# Patient Record
Sex: Male | Born: 1955 | Race: Black or African American | Hispanic: No | Smoking: Former smoker
Health system: Southern US, Community
[De-identification: ages and names within clinical notes are randomized; demographics above are authoritative.]

## PROBLEM LIST (undated history)

## (undated) DIAGNOSIS — Z87891 Personal history of nicotine dependence: Secondary | ICD-10-CM

## (undated) HISTORY — PX: KNEE SURGERY: SHX244

## (undated) HISTORY — PX: ANKLE SURGERY: SHX546

---

## 2013-03-16 ENCOUNTER — Encounter (HOSPITAL_COMMUNITY): Payer: Self-pay | Admitting: Emergency Medicine

## 2013-03-16 ENCOUNTER — Emergency Department (HOSPITAL_COMMUNITY): Payer: Self-pay

## 2013-03-16 ENCOUNTER — Inpatient Hospital Stay (HOSPITAL_COMMUNITY)
Admission: EM | Admit: 2013-03-16 | Discharge: 2013-03-21 | DRG: 348 | Disposition: A | Discharge status: Home / Self Care | Payer: MEDICAID | Attending: General Surgery | Admitting: General Surgery

## 2013-03-16 DIAGNOSIS — K612 Anorectal abscess: Principal | ICD-10-CM | POA: Diagnosis present

## 2013-03-16 DIAGNOSIS — K921 Melena: Secondary | ICD-10-CM | POA: Diagnosis present

## 2013-03-16 DIAGNOSIS — Z87891 Personal history of nicotine dependence: Secondary | ICD-10-CM

## 2013-03-16 DIAGNOSIS — R0602 Shortness of breath: Secondary | ICD-10-CM | POA: Diagnosis not present

## 2013-03-16 DIAGNOSIS — K611 Rectal abscess: Secondary | ICD-10-CM | POA: Diagnosis present

## 2013-03-16 DIAGNOSIS — K648 Other hemorrhoids: Secondary | ICD-10-CM | POA: Diagnosis present

## 2013-03-16 DIAGNOSIS — K644 Residual hemorrhoidal skin tags: Secondary | ICD-10-CM | POA: Diagnosis present

## 2013-03-16 DIAGNOSIS — D72829 Elevated white blood cell count, unspecified: Secondary | ICD-10-CM | POA: Diagnosis present

## 2013-03-16 HISTORY — DX: Personal history of nicotine dependence: Z87.891

## 2013-03-16 LAB — CBC WITH DIFFERENTIAL/PLATELET
Basophils Absolute: 0 10*3/uL (ref 0.0–0.1)
Basophils Relative: 0 % (ref 0–1)
Eosinophils Relative: 0 % (ref 0–5)
HCT: 40.8 % (ref 39.0–52.0)
Hemoglobin: 13.9 g/dL (ref 13.0–17.0)
Lymphocytes Relative: 12 % (ref 12–46)
Monocytes Relative: 8 % (ref 3–12)
Neutro Abs: 17.4 10*3/uL — ABNORMAL HIGH (ref 1.7–7.7)
RBC: 4.49 MIL/uL (ref 4.22–5.81)
RDW: 12.2 % (ref 11.5–15.5)
WBC: 21.7 10*3/uL — ABNORMAL HIGH (ref 4.0–10.5)

## 2013-03-16 LAB — BASIC METABOLIC PANEL
BUN: 12 mg/dL (ref 6–23)
CO2: 28 mEq/L (ref 19–32)
Chloride: 96 mEq/L (ref 96–112)
GFR calc Af Amer: >90 mL/min (ref >90)
Potassium: 3.7 mEq/L (ref 3.5–5.1)

## 2013-03-16 MED ORDER — ONDANSETRON HCL 4 MG/2ML IJ SOLN
4.0000 mg | Freq: Once | INTRAMUSCULAR | Status: AC
  Administered 2013-03-16: 4 mg via INTRAVENOUS
  Filled 2013-03-16: qty 2

## 2013-03-16 MED ORDER — IOHEXOL 300 MG/ML  SOLN
50.0000 mL | Freq: Once | INTRAMUSCULAR | Status: AC | PRN
  Administered 2013-03-16: 50 mL via ORAL

## 2013-03-16 MED ORDER — MORPHINE SULFATE 4 MG/ML IJ SOLN
4.0000 mg | Freq: Once | INTRAMUSCULAR | Status: AC
  Administered 2013-03-16: 4 mg via INTRAVENOUS
  Filled 2013-03-16: qty 1

## 2013-03-16 MED ORDER — IOHEXOL 300 MG/ML  SOLN
100.0000 mL | Freq: Once | INTRAMUSCULAR | Status: AC | PRN
  Administered 2013-03-16: 100 mL via INTRAVENOUS

## 2013-03-16 MED ORDER — SODIUM CHLORIDE 0.9 % IV BOLUS (SEPSIS)
1000.0000 mL | Freq: Once | INTRAVENOUS | Status: AC
  Administered 2013-03-16: 1000 mL via INTRAVENOUS

## 2013-03-16 NOTE — ED Notes (Signed)
Pt presents today with c/o of hemorrhoids and bloody stool. Pt reports being constipated for three weeks, however had a bowel movement last night.  Pt reports having a fever last night of 102.6. Triage temp was 99.4.

## 2013-03-16 NOTE — ED Provider Notes (Signed)
CSN: [id number]     Arrival date & time 03/16/13  1645 History   First MD Initiated Contact with Patient 03/16/13 1703     Chief Complaint  Patient presents with  . Hemorrhoids   (Consider location/radiation/quality/duration/timing/severity/associated sxs/prior Treatment) HPI Comments: Patient presents to ER for evaluation of hemorrhoids. Patient reports that he has been having hemorrhoid pain for approximately 3 weeks. Symptoms have progressively worsened. He has a history of recurrent hemorrhoids, but has never had a significant problem. Patient reports that he has been using preparation H., ice baths, no improvement. This is unusual for him. Last night he ran a fever to 102.6. He has not had any sinus congestion, sore throat, cough, nausea, vomiting to explain symptoms. He did have diarrhea earlier in the week, this has resolved. Patient denies abdominal pain.   History reviewed. No pertinent past medical history. Past Surgical History  Procedure Laterality Date  . Ankle surgery     No family history on file. History  Substance Use Topics  . Smoking status: Former Smoker -- 0.25 packs/day    Types: Cigarettes    Quit date: 02/23/2013  . Smokeless tobacco: Never Used  . Alcohol Use: No    Review of Systems  Constitutional: Positive for fever.  Respiratory: Negative for shortness of breath.   Cardiovascular: Negative for chest pain.  Gastrointestinal: Positive for diarrhea, blood in stool and rectal pain. Negative for abdominal pain.  All other systems reviewed and are negative.    Allergies  Review of patient's allergies indicates not on file.  Home Medications  No current outpatient prescriptions on file. BP 141/88  Pulse 147  Temp(Src) 99.4 F (37.4 C) (Oral)  Resp 20  SpO2 98% Physical Exam  Constitutional: He is oriented to person, place, and time. He appears well-developed and well-nourished. No distress.  HENT:  Head: Normocephalic and atraumatic.  Right Ear:  Hearing normal.  Left Ear: Hearing normal.  Nose: Nose normal.  Mouth/Throat: Oropharynx is clear and moist and mucous membranes are normal.  Eyes: Conjunctivae and EOM are normal. Pupils are equal, round, and reactive to light.  Neck: Normal range of motion. Neck supple.  Cardiovascular: Regular rhythm, S1 normal and S2 normal.  Exam reveals no gallop and no friction rub.   No murmur heard. Pulmonary/Chest: Effort normal and breath sounds normal. No respiratory distress. He exhibits no tenderness.  Abdominal: Soft. Normal appearance and bowel sounds are normal. There is no hepatosplenomegaly. There is no tenderness. There is no rebound, no guarding, no tenderness at McBurney's point and negative Murphy's sign. No hernia.  Genitourinary: Rectal exam shows external hemorrhoid.  Musculoskeletal: Normal range of motion.  Neurological: He is alert and oriented to person, place, and time. He has normal strength. No cranial nerve deficit or sensory deficit. Coordination normal. GCS eye subscore is 4. GCS verbal subscore is 5. GCS motor subscore is 6.  Skin: Skin is warm, dry and intact. No rash noted. No cyanosis.  Psychiatric: He has a normal mood and affect. His speech is normal and behavior is normal. Thought content normal.    ED Course  Procedures (including critical care time) Labs Review Labs Reviewed  CBC WITH DIFFERENTIAL - Abnormal; Notable for the following:    WBC 21.7 (*)    Neutrophils Relative % 80 (*)    Neutro Abs 17.4 (*)    Monocytes Absolute 1.7 (*)    All other components within normal limits  BASIC METABOLIC PANEL - Abnormal; Notable for the following:  Sodium 133 (*)    Glucose, Bld 107 (*)    GFR calc non Af Amer 78 (*)    All other components within normal limits  CULTURE, BLOOD (ROUTINE X 2)  CULTURE, BLOOD (ROUTINE X 2)   Imaging Review Ct Abdomen Pelvis W Contrast  03/16/2013   *RADIOLOGY REPORT*  Clinical Data: Rectal pain.  Fever.  Leukocytosis.  CT  ABDOMEN AND PELVIS WITH CONTRAST  Technique:  Multidetector CT imaging of the abdomen and pelvis was performed following the standard protocol during bolus administration of intravenous contrast.  Contrast: OMNIPAQUE IOHEXOL 300 MG/ML  SOLN  Comparison: None.  Findings: Lung Bases: Dependent atelectasis at the lung bases.  Liver:  Tiny low density lesion in the left hepatic lobe likely represents a cyst.  Spleen:  Normal.  Gallbladder:  Normal.  Common bile duct:  Normal.  Pancreas:  Normal.  Adrenal glands:  Mild thickening on the left, likely hyperplasia. No discrete lesion.  Kidneys:  Simple left upper pole renal cyst.  Normal renal enhancement.  A 3 mm right inferior pole renal collecting system calculus.  Ureters appear within normal limits.  Delayed renal excretion of contrast is normal.  Stomach:  Normal.  Small bowel:  Normal.  Colon:   Appendix not identified.  Distal colon decompressed. There is a perirectal fluid collection which is crescent shaped and extends from the dorsal aspect of the rectum into the perineal subcutaneous fat.  The maximal thickness in the AP dimension is 4.3 cm.  Transversely, this measures 8 cm.  Cranial-caudal extent is at least 6 cm. There is peri rectal phlegmon and fluid in the ischiorectal fossa.  Adjacent to the rectum, the fluid collection appears complex.  This is consistent with abscess.  Pelvic Genitourinary:  Distended urinary bladder.  Prostate gland appears within normal limits.  Bones:  No aggressive osseous lesions.  Ankylosis of the SI joints.  Vasculature: Retroaortic left renal vein.  Body Wall: Small fat containing lumbar hernia on the right.  IMPRESSION: 1.  Perirectal fluid collection consistent with perirectal abscess. This lies dorsal to the anorectal junction measuring 8 cm long axis. 2.  Left renal cyst. 3.  Nonobstructing right inferior pole 3 mm renal collecting system calculus.   Original Report Authenticated By: Andreas Newport, M.D.    MDM   Diagnosis: Perirectal abscess   Patient presents to ER stating that he has been experiencing 3 weeks of progressively worsening hemorrhoid pain. Patient reports that he has had problems with hemorrhoids in the past, but never this bad. Initial examination revealed no abdominal tenderness and he has not had any abdominal pain. External examination reveals a moderately sized external hemorrhoid that is not thrombosed. The rectal examination was performed because of the presence of a hemorrhoids and the amount of discomfort the patient was experiencing. Patient tells me that he had a temperature of 102.6 last night which is not explained by the hemorrhoid. I opted to perform blood work to further evaluate and he does have a significant leukocytosis. CAT scan was therefore performed to rule out pelvic abscess and there is a perirectal fluid collection consistent with a perirectal abscess seen. General Surgery Consulted, Dr. Biagio Quint - is in OR currently, will see patient after he is out of surgery.    Gilda Crease, MD 03/16/13 2159

## 2013-03-16 NOTE — ED Notes (Signed)
MD at bedside. 

## 2013-03-16 NOTE — ED Notes (Signed)
Patient transported to CT 

## 2013-03-16 NOTE — ED Notes (Signed)
Pt refusing IV unless he needs antibiotics

## 2013-03-16 NOTE — H&P (Signed)
Reason for Consult:perirectal abscess Referring Physician: Sharbel Sahagun is an 57 y.o. male.  HPI: 3 week history of hemorrhoids which he has been treating with preparation H.  This had been helping his symptoms until earlier this week when he began having pain in the rectal area and he had associated fever of 102 last night.  He also has had some clotted blood in the stool yesterday but says that his bowels have been normal otherwise.  He came in today because of the pain and CT scan was ordered which demonstrated perirectal abscess  History reviewed. No pertinent past medical history.  Past Surgical History  Procedure Laterality Date  . Ankle surgery      No family history on file.  Social History:  reports that he quit smoking about 3 weeks ago. His smoking use included Cigarettes. He smoked 0.25 packs per day. He has never used smokeless tobacco. He reports that he does not drink alcohol or use illicit drugs.  Allergies:  Allergies  Allergen Reactions  . Penicillins     Medications: I have reviewed the patient's current medications.  Results for orders placed during the hospital encounter of 03/16/13 (from the past 48 hour(s))  CBC WITH DIFFERENTIAL     Status: Abnormal   Collection Time    03/16/13  5:49 PM      Result Value Range   WBC 21.7 (*) 4.0 - 10.5 K/uL   RBC 4.49  4.22 - 5.81 MIL/uL   Hemoglobin 13.9  13.0 - 17.0 g/dL   HCT 40.9  81.1 - 91.4 %   MCV 90.9  78.0 - 100.0 fL   MCH 31.0  26.0 - 34.0 pg   MCHC 34.1  30.0 - 36.0 g/dL   RDW 78.2  95.6 - 21.3 %   Platelets 366  150 - 400 K/uL   Neutrophils Relative % 80 (*) 43 - 77 %   Lymphocytes Relative 12  12 - 46 %   Monocytes Relative 8  3 - 12 %   Eosinophils Relative 0  0 - 5 %   Basophils Relative 0  0 - 1 %   Neutro Abs 17.4 (*) 1.7 - 7.7 K/uL   Lymphs Abs 2.6  0.7 - 4.0 K/uL   Monocytes Absolute 1.7 (*) 0.1 - 1.0 K/uL   Eosinophils Absolute 0.0  0.0 - 0.7 K/uL   Basophils Absolute 0.0  0.0  - 0.1 K/uL   Smear Review MORPHOLOGY UNREMARKABLE    BASIC METABOLIC PANEL     Status: Abnormal   Collection Time    03/16/13  5:49 PM      Result Value Range   Sodium 133 (*) 135 - 145 mEq/L   Potassium 3.7  3.5 - 5.1 mEq/L   Chloride 96  96 - 112 mEq/L   CO2 28  19 - 32 mEq/L   Glucose, Bld 107 (*) 70 - 99 mg/dL   BUN 12  6 - 23 mg/dL   Creatinine, Ser 0.86  0.50 - 1.35 mg/dL   Calcium 9.6  8.4 - 57.8 mg/dL   GFR calc non Af Amer 78 (*) >90 mL/min   GFR calc Af Amer >90  >90 mL/min   Comment: (NOTE)     The eGFR has been calculated using the CKD EPI equation.     This calculation has not been validated in all clinical situations.     eGFR's persistently <90 mL/min signify possible Chronic Kidney  Disease.    Ct Abdomen Pelvis W Contrast  03/16/2013   *RADIOLOGY REPORT*  Clinical Data: Rectal pain.  Fever.  Leukocytosis.  CT ABDOMEN AND PELVIS WITH CONTRAST  Technique:  Multidetector CT imaging of the abdomen and pelvis was performed following the standard protocol during bolus administration of intravenous contrast.  Contrast: OMNIPAQUE IOHEXOL 300 MG/ML  SOLN  Comparison: None.  Findings: Lung Bases: Dependent atelectasis at the lung bases.  Liver:  Tiny low density lesion in the left hepatic lobe likely represents a cyst.  Spleen:  Normal.  Gallbladder:  Normal.  Common bile duct:  Normal.  Pancreas:  Normal.  Adrenal glands:  Mild thickening on the left, likely hyperplasia. No discrete lesion.  Kidneys:  Simple left upper pole renal cyst.  Normal renal enhancement.  A 3 mm right inferior pole renal collecting system calculus.  Ureters appear within normal limits.  Delayed renal excretion of contrast is normal.  Stomach:  Normal.  Small bowel:  Normal.  Colon:   Appendix not identified.  Distal colon decompressed. There is a perirectal fluid collection which is crescent shaped and extends from the dorsal aspect of the rectum into the perineal subcutaneous fat.  The maximal  thickness in the AP dimension is 4.3 cm.  Transversely, this measures 8 cm.  Cranial-caudal extent is at least 6 cm. There is peri rectal phlegmon and fluid in the ischiorectal fossa.  Adjacent to the rectum, the fluid collection appears complex.  This is consistent with abscess.  Pelvic Genitourinary:  Distended urinary bladder.  Prostate gland appears within normal limits.  Bones:  No aggressive osseous lesions.  Ankylosis of the SI joints.  Vasculature: Retroaortic left renal vein.  Body Wall: Small fat containing lumbar hernia on the right.  IMPRESSION: 1.  Perirectal fluid collection consistent with perirectal abscess. This lies dorsal to the anorectal junction measuring 8 cm long axis. 2.  Left renal cyst. 3.  Nonobstructing right inferior pole 3 mm renal collecting system calculus.   Original Report Authenticated By: Andreas Newport, M.D.    All other review of systems negative or noncontributory except as stated in the HPI   Blood pressure 127/78, pulse 79, temperature 99.4 F (37.4 C), temperature source Oral, resp. rate 18, SpO2 98.00%. General appearance: alert, cooperative and no distress Resp: clear to auscultation bilaterally Cardio: normal rate, regular GI: soft, non-tender; bowel sounds normal; no masses,  no organomegaly and rectal exam not performed due to pain.  he does have two large hemorrhoids with evidence of thrombosis, these are tender but he is more tender in the posterior midline.  I do not appreciate any fluctuance or any external drainage. Extremities: extremities normal, atraumatic, no cyanosis or edema Neurologic: Grossly normal  Assessment/Plan: Perirectal abscess and hemorrhoids I think that this would be best examined with EUA.  He has a large abscess on CT with elevated wbc and fever yesterday.  He is HD stable and he really does not have any erythema or induration or fluctuance externally.  I have recommended admission with IV abx and rectal EUA for possible  drainage of the abscess.  Lodema Pilot DAVID 03/16/2013, 11:42 PM

## 2013-03-16 NOTE — ED Notes (Signed)
Dr. Darylene Price at beside.

## 2013-03-17 ENCOUNTER — Encounter (HOSPITAL_COMMUNITY): Payer: Self-pay | Admitting: Anesthesiology

## 2013-03-17 ENCOUNTER — Encounter (HOSPITAL_COMMUNITY): Admission: EM | Disposition: A | Discharge status: Home / Self Care | Payer: Self-pay | Source: Home / Self Care

## 2013-03-17 ENCOUNTER — Inpatient Hospital Stay (HOSPITAL_COMMUNITY): Payer: Self-pay | Admitting: Anesthesiology

## 2013-03-17 DIAGNOSIS — K612 Anorectal abscess: Secondary | ICD-10-CM

## 2013-03-17 HISTORY — PX: INCISION AND DRAINAGE PERIRECTAL ABSCESS: SHX1804

## 2013-03-17 LAB — CBC
HCT: 36.8 % — ABNORMAL LOW (ref 39.0–52.0)
MCV: 91.1 fL (ref 78.0–100.0)
RBC: 4.04 MIL/uL — ABNORMAL LOW (ref 4.22–5.81)
WBC: 24.1 10*3/uL — ABNORMAL HIGH (ref 4.0–10.5)

## 2013-03-17 SURGERY — INCISION AND DRAINAGE, ABSCESS, PERIRECTAL
Anesthesia: General | Site: Rectum | Wound class: Dirty or Infected

## 2013-03-17 MED ORDER — ONDANSETRON HCL 4 MG/2ML IJ SOLN: 4.0000 mg | Freq: Four times a day (QID) | INTRAMUSCULAR | Status: DC | PRN

## 2013-03-17 MED ORDER — MIDAZOLAM HCL 5 MG/5ML IJ SOLN
INTRAMUSCULAR | Status: DC | PRN
  Administered 2013-03-17: 2 mg via INTRAVENOUS

## 2013-03-17 MED ORDER — PSYLLIUM 95 % PO PACK
1.0000 | PACK | Freq: Two times a day (BID) | ORAL | Status: DC
  Administered 2013-03-17 – 2013-03-21 (×9): 1 via ORAL
  Filled 2013-03-17 (×11): qty 1

## 2013-03-17 MED ORDER — MORPHINE SULFATE 2 MG/ML IJ SOLN: 1.0000 mg | INTRAMUSCULAR | Status: DC | PRN

## 2013-03-17 MED ORDER — ONDANSETRON HCL 4 MG/2ML IJ SOLN
INTRAMUSCULAR | Status: DC | PRN
  Administered 2013-03-17: 4 mg via INTRAVENOUS

## 2013-03-17 MED ORDER — HEPARIN SODIUM (PORCINE) 5000 UNIT/ML IJ SOLN
5000.0000 [IU] | Freq: Three times a day (TID) | INTRAMUSCULAR | Status: DC
  Administered 2013-03-17 – 2013-03-20 (×9): 5000 [IU] via SUBCUTANEOUS
  Filled 2013-03-17 (×13): qty 1

## 2013-03-17 MED ORDER — BUPIVACAINE LIPOSOME 1.3 % IJ SUSP
20.0000 mL | Freq: Once | INTRAMUSCULAR | Status: DC
  Filled 2013-03-17: qty 20

## 2013-03-17 MED ORDER — HYDROMORPHONE HCL PF 1 MG/ML IJ SOLN
INTRAMUSCULAR | Status: AC
  Filled 2013-03-17: qty 1

## 2013-03-17 MED ORDER — KCL IN DEXTROSE-NACL 20-5-0.45 MEQ/L-%-% IV SOLN
INTRAVENOUS | Status: DC
  Administered 2013-03-17: 01:00:00 via INTRAVENOUS
  Filled 2013-03-17 (×3): qty 1000

## 2013-03-17 MED ORDER — DIAZEPAM 5 MG PO TABS: 5.0000 mg | ORAL_TABLET | Freq: Four times a day (QID) | ORAL | Status: DC | PRN

## 2013-03-17 MED ORDER — PROMETHAZINE HCL 25 MG/ML IJ SOLN: 6.2500 mg | INTRAMUSCULAR | Status: DC | PRN

## 2013-03-17 MED ORDER — SUCCINYLCHOLINE CHLORIDE 20 MG/ML IJ SOLN
INTRAMUSCULAR | Status: DC | PRN
  Administered 2013-03-17: 100 mg via INTRAVENOUS

## 2013-03-17 MED ORDER — CIPROFLOXACIN IN D5W 400 MG/200ML IV SOLN
400.0000 mg | Freq: Two times a day (BID) | INTRAVENOUS | Status: DC
  Administered 2013-03-17 – 2013-03-18 (×3): 400 mg via INTRAVENOUS
  Filled 2013-03-17 (×4): qty 200

## 2013-03-17 MED ORDER — SODIUM CHLORIDE 0.9 % IJ SOLN
INTRAMUSCULAR | Status: DC | PRN
  Administered 2013-03-17: 11:00:00

## 2013-03-17 MED ORDER — LACTATED RINGERS IV SOLN: INTRAVENOUS | Status: DC

## 2013-03-17 MED ORDER — LACTATED RINGERS IV SOLN
INTRAVENOUS | Status: DC | PRN
  Administered 2013-03-17: 10:00:00 via INTRAVENOUS

## 2013-03-17 MED ORDER — KCL IN DEXTROSE-NACL 20-5-0.45 MEQ/L-%-% IV SOLN
INTRAVENOUS | Status: DC
  Administered 2013-03-17: 20 mL/h via INTRAVENOUS
  Filled 2013-03-17 (×3): qty 1000

## 2013-03-17 MED ORDER — HYDROMORPHONE HCL PF 1 MG/ML IJ SOLN
0.2500 mg | INTRAMUSCULAR | Status: DC | PRN
  Administered 2013-03-17 (×2): 0.5 mg via INTRAVENOUS

## 2013-03-17 MED ORDER — PROPOFOL 10 MG/ML IV BOLUS
INTRAVENOUS | Status: DC | PRN
  Administered 2013-03-17: 150 mg via INTRAVENOUS

## 2013-03-17 MED ORDER — METRONIDAZOLE IN NACL 5-0.79 MG/ML-% IV SOLN
500.0000 mg | Freq: Three times a day (TID) | INTRAVENOUS | Status: DC
  Administered 2013-03-17 – 2013-03-18 (×4): 500 mg via INTRAVENOUS
  Filled 2013-03-17 (×5): qty 100

## 2013-03-17 MED ORDER — FENTANYL CITRATE 0.05 MG/ML IJ SOLN
INTRAMUSCULAR | Status: DC | PRN
  Administered 2013-03-17: 100 ug via INTRAVENOUS
  Administered 2013-03-17 (×2): 50 ug via INTRAVENOUS

## 2013-03-17 MED ORDER — DOCUSATE SODIUM 100 MG PO CAPS
100.0000 mg | ORAL_CAPSULE | Freq: Two times a day (BID) | ORAL | Status: DC
  Filled 2013-03-17 (×4): qty 1

## 2013-03-17 SURGICAL SUPPLY — 19 items
BLADE SURG SZ10 CARB STEEL (BLADE) ×2 IMPLANT
CLOTH BEACON ORANGE TIMEOUT ST (SAFETY) ×2 IMPLANT
DRAIN PENROSE 18X1/4 LTX STRL (WOUND CARE) ×2 IMPLANT
DRSG PAD ABDOMINAL 8X10 ST (GAUZE/BANDAGES/DRESSINGS) ×2 IMPLANT
GLOVE BIOGEL PI IND STRL 7.0 (GLOVE) ×1 IMPLANT
GLOVE BIOGEL PI INDICATOR 7.0 (GLOVE) ×1
GOWN STRL REIN XL XLG (GOWN DISPOSABLE) ×4 IMPLANT
LUBRICANT JELLY K Y 4OZ (MISCELLANEOUS) ×2 IMPLANT
NDL SAFETY ECLIPSE 18X1.5 (NEEDLE) ×1 IMPLANT
NEEDLE HYPO 18GX1.5 SHARP (NEEDLE) ×1
NEEDLE HYPO 22GX1.5 SAFETY (NEEDLE) IMPLANT
PACK LITHOTOMY IV (CUSTOM PROCEDURE TRAY) ×2 IMPLANT
PENCIL BUTTON HOLSTER BLD 10FT (ELECTRODE) ×2 IMPLANT
SOL PREP POV-IOD 16OZ 10% (MISCELLANEOUS) ×2 IMPLANT
SUT SILK 2 0 SH (SUTURE) ×2 IMPLANT
SYR CONTROL 10ML LL (SYRINGE) ×4 IMPLANT
TAPE CLOTH SURG 4X10 WHT LF (GAUZE/BANDAGES/DRESSINGS) ×2 IMPLANT
TOWEL OR 17X26 10 PK STRL BLUE (TOWEL DISPOSABLE) ×2 IMPLANT
YANKAUER SUCT BULB TIP 10FT TU (MISCELLANEOUS) ×2 IMPLANT

## 2013-03-17 NOTE — Anesthesia Postprocedure Evaluation (Signed)
  Anesthesia Post-op Note  Patient: Aaron Everett  Procedure(s) Performed: Procedure(s) (LRB): IRRIGATION AND DEBRIDEMENT PERIRECTAL ABSCESS (N/A)  Patient Location: PACU  Anesthesia Type: General  Level of Consciousness: awake and alert   Airway and Oxygen Therapy: Patient Spontanous Breathing  Post-op Pain: mild  Post-op Assessment: Post-op Vital signs reviewed, Patient's Cardiovascular Status Stable, Respiratory Function Stable, Patent Airway and No signs of Nausea or vomiting  Last Vitals:  Filed Vitals:   03/17/13 0914  BP: 108/69  Pulse: 85  Temp: 37 C  Resp: 18    Post-op Vital Signs: stable   Complications: No apparent anesthesia complications

## 2013-03-17 NOTE — Preoperative (Signed)
Beta Blockers   Reason not to administer Beta Blockers:Not Applicable 

## 2013-03-17 NOTE — Interval H&P Note (Signed)
History and Physical Interval Note:  03/17/2013 9:47 AM  Aaron Everett  has presented today for surgery, with the diagnosis of rectal abscess  The various methods of treatment have been discussed with the patient and family. After consideration of risks, benefits and other options for treatment, the patient has consented to  Procedure(s) with comments: IRRIGATION AND DEBRIDEMENT PERIRECTAL ABSCESS (N/A) - RECTAL  EXAM  UNDER  ANESTHESIA / INCISION AND DRAINAGE RECTAL ABSCESS, POSSIBLE  HEMORRHOIDECTOMY as a surgical intervention .  The patient's history has been reviewed, patient examined, no change in status, stable for surgery.  I have reviewed the patient's chart and labs.  Questions were answered to the patient's satisfaction.   Pt appears to have a horseshoe abscess on CT.    Vanita Panda, MD  Colorectal and General Surgery [facility]

## 2013-03-17 NOTE — Anesthesia Preprocedure Evaluation (Addendum)
Anesthesia Evaluation  Patient identified by MRN, date of birth, ID band Patient awake    Reviewed: Allergy & Precautions, H&P , NPO status , Patient's Chart, lab work & pertinent test results  Airway Mallampati: II TM Distance: >3 FB Neck ROM: Full    Dental  (+) Poor Dentition, Dental Advisory Given, Missing and Chipped,  Many missing, broken and cracked teeth noted preoperatively.:   Pulmonary Current Smoker,  Quit smoking 3 weeks ago. breath sounds clear to auscultation  Pulmonary exam normal       Cardiovascular Exercise Tolerance: Good negative cardio ROS  Rhythm:Regular Rate:Normal     Neuro/Psych negative neurological ROS  negative psych ROS   GI/Hepatic negative GI ROS, Neg liver ROS,   Endo/Other  negative endocrine ROS  Renal/GU negative Renal ROS  negative genitourinary   Musculoskeletal negative musculoskeletal ROS (+)   Abdominal   Peds negative pediatric ROS (+)  Hematology negative hematology ROS (+)   Anesthesia Other Findings   Reproductive/Obstetrics negative OB ROS                         Anesthesia Physical Anesthesia Plan  ASA: II and emergent  Anesthesia Plan: General   Post-op Pain Management:    Induction: Intravenous  Airway Management Planned: Oral ETT  Additional Equipment:   Intra-op Plan:   Post-operative Plan: Extubation in OR  Informed Consent: I have reviewed the patients History and Physical, chart, labs and discussed the procedure including the risks, benefits and alternatives for the proposed anesthesia with the patient or authorized representative who has indicated his/her understanding and acceptance.   Dental advisory given  Plan Discussed with: CRNA  Anesthesia Plan Comments:         Anesthesia Quick Evaluation

## 2013-03-17 NOTE — Transfer of Care (Signed)
Immediate Anesthesia Transfer of Care Note  Patient: Aaron Everett  Procedure(s) Performed: Procedure(s) (LRB): IRRIGATION AND DEBRIDEMENT PERIRECTAL ABSCESS (N/A)  Patient Location: PACU  Anesthesia Type: General  Level of Consciousness: sedated, patient cooperative and responds to stimulaton  Airway & Oxygen Therapy: Patient Spontanous Breathing and Patient connected to face mask oxgen  Post-op Assessment: Report given to PACU RN and Post -op Vital signs reviewed and stable  Post vital signs: Reviewed and stable  Complications: No apparent anesthesia complications

## 2013-03-17 NOTE — Progress Notes (Signed)
Partial denture returned to patient in pacu, he place them in his mouth

## 2013-03-17 NOTE — Op Note (Signed)
03/16/2013 - 03/17/2013  11:04 AM  PATIENT:  Aaron Everett  57 y.o. male  Patient has no care team.  PRE-OPERATIVE DIAGNOSIS:  Horseshoe rectal abscess  POST-OPERATIVE DIAGNOSIS:  Horseshoe rectal abscess  PROCEDURE:  IRRIGATION AND DEBRIDEMENT HORSESHOE PERIRECTAL ABSCESS: HANLEY PROCEEDURE SURGEON:  Surgeon(s): Romie Levee, MD  ASSISTANT: none   ANESTHESIA:   general  EBL:     Delay start of Pharmacological VTE agent (>24hrs) due to surgical blood loss or risk of bleeding:  no  DRAINS: Penrose drain in the perirectal space   SPECIMEN:  No Specimen  DISPOSITION OF SPECIMEN:  N/A  COUNTS:  YES  PLAN OF CARE: Pt already an inpatient  PATIENT DISPOSITION:  PACU - hemodynamically stable.  INDICATION: this is a 57 year old male who presented to the emergency department last night with severe rectal pain. This has been going on for about a week. A CT scan was performed which showed a horseshoe perirectal abscess. The risks and benefits of incision and drainage of this were explained to the patient prior the OR consent was signed and placed on chart.  OR FINDINGS: horseshoe abscess  DESCRIPTION: The patient was identified in the preoperative holding area and taken to the OR where they were laid prone on the operating room table in jackknife position. Gen. anesthesia was smoothly induced.  The patient was then prepped and draped in the usual sterile fashion. A surgical timeout was performed indicating the correct patient, procedure, positioning and preoperative antibiotics. SCDs were noted to be in place and functioning prior to the operation.  I began by performing an anal exam using an Hill-Ferguson anoscope. The entire anal canal was evaluated. There was purulence noted in the posterior midline space. There was an engorged right posterior hemorrhoid with a small grade 4 associated internal hemorrhoid. The remaining anal exam was normal. I began by inserting a needle  into the posterior anal space and withdrawing purulence. I then opened up and the space just proximal to the external sphincter using Bovie electrocautery. A large amount of purulence was extruded from the cavity. I then evaluated the internal cavity by palpation. Counterincisions were made on the left and right side for added drainage. Hemostasis was achieved by packing. Once hemostasis was achieved I removed the packing and placed a Penrose drain which was sutured into a loop formation. I evaluated the anal canal again for hemostasis. All bleeding had stopped. I then inserted with 30 mL of diluted Exparel into the perianal regions as a rectal block.  The area was then covered with a sterile dressing. The patient was then awakened from anesthesia and sent to the post anesthesia care in stable condition. All counts were correct per operating room staff.

## 2013-03-18 DIAGNOSIS — K611 Rectal abscess: Secondary | ICD-10-CM | POA: Diagnosis present

## 2013-03-18 LAB — CBC
Hemoglobin: 11.7 g/dL — ABNORMAL LOW (ref 13.0–17.0)
MCH: 30.2 pg (ref 26.0–34.0)
MCHC: 32.7 g/dL (ref 30.0–36.0)
Platelets: 390 10*3/uL (ref 150–400)
RDW: 12.7 % (ref 11.5–15.5)

## 2013-03-18 MED ORDER — METRONIDAZOLE 500 MG PO TABS
500.0000 mg | ORAL_TABLET | Freq: Three times a day (TID) | ORAL | Status: DC
  Administered 2013-03-18 – 2013-03-21 (×9): 500 mg via ORAL
  Filled 2013-03-18 (×13): qty 1

## 2013-03-18 MED ORDER — CIPROFLOXACIN HCL 500 MG PO TABS
500.0000 mg | ORAL_TABLET | Freq: Two times a day (BID) | ORAL | Status: DC
  Administered 2013-03-18 – 2013-03-21 (×7): 500 mg via ORAL
  Filled 2013-03-18 (×11): qty 1

## 2013-03-18 MED ORDER — OXYCODONE HCL 5 MG PO TABS: 5.0000 mg | ORAL_TABLET | ORAL | Status: DC | PRN

## 2013-03-18 MED ORDER — ACETAMINOPHEN 325 MG PO TABS
650.0000 mg | ORAL_TABLET | ORAL | Status: DC | PRN
  Administered 2013-03-18: 650 mg via ORAL
  Filled 2013-03-18: qty 2

## 2013-03-18 NOTE — Progress Notes (Signed)
1 Day Post-Op  Subjective: Feeling better, having a lot of drainage  Objective: Vital signs in last 24 hours: Temp:  [98.2 F (36.8 C)-99.1 F (37.3 C)] 98.6 F (37 C) (08/31 0530) Pulse Rate:  [66-95] 66 (08/31 0530) Resp:  [11-19] 18 (08/31 0530) BP: (100-131)/(61-85) 101/61 mmHg (08/31 0530) SpO2:  [98 %-100 %] 100 % (08/31 0530)   Intake/Output from previous day: 08/30 0701 - 08/31 0700 In: 1075.3 [P.O.:240; I.V.:835.3] Out: 302 [Urine:302] Intake/Output this shift:   General appearance: alert and cooperative GI: normal findings: soft, non-tender  Incision: draining well  Lab Results:   Recent Labs  03/17/13 0425 03/18/13 0529  WBC 24.1* 18.3*  HGB 12.3* 11.7*  HCT 36.8* 35.8*  PLT 359 390   BMET  Recent Labs  03/16/13 1749  NA 133*  K 3.7  CL 96  CO2 28  GLUCOSE 107*  BUN 12  CREATININE 1.04  CALCIUM 9.6   PT/INR No results found for this basename: LABPROT, INR,  in the last 72 hours ABG No results found for this basename: PHART, PCO2, PO2, HCO3,  in the last 72 hours  MEDS, Scheduled . ciprofloxacin  400 mg Intravenous Q12H  . docusate sodium  100 mg Oral BID  . heparin  5,000 Units Subcutaneous Q8H  . metronidazole  500 mg Intravenous Q8H  . psyllium  1 packet Oral BID    Studies/Results: Ct Abdomen Pelvis W Contrast  03/16/2013   *RADIOLOGY REPORT*  Clinical Data: Rectal pain.  Fever.  Leukocytosis.  CT ABDOMEN AND PELVIS WITH CONTRAST  Technique:  Multidetector CT imaging of the abdomen and pelvis was performed following the standard protocol during bolus administration of intravenous contrast.  Contrast: OMNIPAQUE IOHEXOL 300 MG/ML  SOLN  Comparison: None.  Findings: Lung Bases: Dependent atelectasis at the lung bases.  Liver:  Tiny low density lesion in the left hepatic lobe likely represents a cyst.  Spleen:  Normal.  Gallbladder:  Normal.  Common bile duct:  Normal.  Pancreas:  Normal.  Adrenal glands:  Mild thickening on the left,  likely hyperplasia. No discrete lesion.  Kidneys:  Simple left upper pole renal cyst.  Normal renal enhancement.  A 3 mm right inferior pole renal collecting system calculus.  Ureters appear within normal limits.  Delayed renal excretion of contrast is normal.  Stomach:  Normal.  Small bowel:  Normal.  Colon:   Appendix not identified.  Distal colon decompressed. There is a perirectal fluid collection which is crescent shaped and extends from the dorsal aspect of the rectum into the perineal subcutaneous fat.  The maximal thickness in the AP dimension is 4.3 cm.  Transversely, this measures 8 cm.  Cranial-caudal extent is at least 6 cm. There is peri rectal phlegmon and fluid in the ischiorectal fossa.  Adjacent to the rectum, the fluid collection appears complex.  This is consistent with abscess.  Pelvic Genitourinary:  Distended urinary bladder.  Prostate gland appears within normal limits.  Bones:  No aggressive osseous lesions.  Ankylosis of the SI joints.  Vasculature: Retroaortic left renal vein.  Body Wall: Small fat containing lumbar hernia on the right.  IMPRESSION: 1.  Perirectal fluid collection consistent with perirectal abscess. This lies dorsal to the anorectal junction measuring 8 cm long axis. 2.  Left renal cyst. 3.  Nonobstructing right inferior pole 3 mm renal collecting system calculus.   Original Report Authenticated By: Andreas Newport, M.D.    Assessment: s/p Procedure(s): IRRIGATION AND DEBRIDEMENT PERIRECTAL  HORSESHOE ABSCESS There are no active problems to display for this patient.   Feeling better  Plan: Reg diet SItz baths TID and PRN PO Abx Recheck CBC in AM   LOS: 2 days     .Aaron Panda, MD [facility], [place] [phone]   03/18/2013 8:40 AM

## 2013-03-18 NOTE — Progress Notes (Addendum)
Pt encouraged all morning to use the sitz bath, it has been set up for him, but he hasn't used it yet.C/O of a headache, given 2 tylenol. Pt states headache is better. He used the sitz bath x1. Reinforced that the MD wants him to use the sitz bath 3-4 times a day. Wet to dry dressing reapplied. At 1930 pt states he just finished his 2nd sitz bath.

## 2013-03-19 ENCOUNTER — Inpatient Hospital Stay (HOSPITAL_COMMUNITY): Payer: Self-pay

## 2013-03-19 LAB — CBC
MCV: 93.4 fL (ref 78.0–100.0)
Platelets: 446 10*3/uL — ABNORMAL HIGH (ref 150–400)
RDW: 12.7 % (ref 11.5–15.5)
WBC: 13.1 10*3/uL — ABNORMAL HIGH (ref 4.0–10.5)

## 2013-03-19 NOTE — Progress Notes (Signed)
C/o shortness of breath at 0145.. VSS On call notified, EKg(-) Cxray(-) tropinin(-) Pateint resting quietly at present

## 2013-03-19 NOTE — Progress Notes (Addendum)
2 Days Post-Op  Subjective: Feeling better, having less drainage, had one BM last night.  Had some SOB last night.  CXR and trop WNL  Objective: Vital signs in last 24 hours: Temp:  [97.8 F (36.6 C)-98 F (36.7 C)] 98 F (36.7 C) (09/01 0601) Pulse Rate:  [61-74] 61 (09/01 0601) Resp:  [18-20] 20 (09/01 0601) BP: (95-126)/(59-87) 126/87 mmHg (09/01 0601) SpO2:  [96 %-98 %] 96 % (09/01 0601)   Intake/Output from previous day: 08/31 0701 - 09/01 0700 In: 2720 [P.O.:2280; I.V.:440] Out: -  Intake/Output this shift:   General appearance: alert and cooperative GI: normal findings: soft, non-tender  Incision: draining well  Lab Results:   Recent Labs  03/18/13 0529 03/19/13 0300  WBC 18.3* 13.1*  HGB 11.7* 12.9*  HCT 35.8* 39.6  PLT 390 446*   BMET  Recent Labs  03/16/13 1749  NA 133*  K 3.7  CL 96  CO2 28  GLUCOSE 107*  BUN 12  CREATININE 1.04  CALCIUM 9.6   PT/INR No results found for this basename: LABPROT, INR,  in the last 72 hours ABG No results found for this basename: PHART, PCO2, PO2, HCO3,  in the last 72 hours  MEDS, Scheduled . ciprofloxacin  500 mg Oral BID  . heparin  5,000 Units Subcutaneous Q8H  . metroNIDAZOLE  500 mg Oral Q8H  . psyllium  1 packet Oral BID    Studies/Results: Dg Chest Port 1 View  03/19/2013   *RADIOLOGY REPORT*  Clinical Data: Shortness of breath  PORTABLE CHEST - 1 VIEW  Comparison: None.  Findings: Cardiac and mediastinal silhouettes are within normal limits.  The lungs are normally inflated.  No pulmonary edema is identified. No definite pleural effusion, although the right costophrenic angle is incompletely visualized.  No airspace consolidation.  No thorax.  The bony thorax is intact.  IMPRESSION: No acute cardiopulmonary process.   Original Report Authenticated By: Rise Mu, M.D.    Assessment: s/p Procedure(s): IRRIGATION AND DEBRIDEMENT PERIRECTAL HORSESHOE ABSCESS Patient Active Problem List   Diagnosis Date Noted  . Perirectal abscess 03/18/2013    Feeling better  Plan: Reg diet SItz baths TID and PRN.  Pt may shower instead of Sitz bath, if he'd like Cont PO Abx for a total of 10 days  Recheck CBC in AM.  If WBC down in AM and patient doing well with shower/sitz bath, ok to d/c home with penrose intact.  He should follow up with me in 4 weeks.     LOS: 3 days     .Vanita Panda, MD [facility], [place] [phone]   03/19/2013 9:23 AM

## 2013-03-19 NOTE — Progress Notes (Signed)
Called because the patient awoke feeling short of breath.  I came to see him and he looks normal at baseline.  He says that he has right sided chest pain with deep inspiration.  He has had mild cough.  He can pull on IS with replication of his symptoms.  His breathing is nonlabored and his lungs are clear without wheeze or crackles or rales.  sats 97% on RA. Placed on 2L [place] and he says that he feels better with the oxygen.  HR is normal.  EKG looks okay but no comparison available.  His cxr looks okay without pneumonia.  This seems to be pleuritic pain and he says that he is feeling better now.  Doubt MI but will check troponin.  No evidence of pneumonia.  Doubt PE as HR normal and breathing normally as well.  sats normal.  No evidence of pneumonia.  He is feeling better now so will continue to observe.

## 2013-03-20 ENCOUNTER — Encounter (HOSPITAL_COMMUNITY): Payer: Self-pay | Admitting: General Surgery

## 2013-03-20 DIAGNOSIS — Z87891 Personal history of nicotine dependence: Secondary | ICD-10-CM

## 2013-03-20 HISTORY — DX: Personal history of nicotine dependence: Z87.891

## 2013-03-20 LAB — CBC
HCT: 36.8 % — ABNORMAL LOW (ref 39.0–52.0)
Hemoglobin: 12 g/dL — ABNORMAL LOW (ref 13.0–17.0)
MCH: 30.3 pg (ref 26.0–34.0)
MCHC: 32.6 g/dL (ref 30.0–36.0)
MCV: 92.9 fL (ref 78.0–100.0)
RDW: 12.6 % (ref 11.5–15.5)

## 2013-03-20 MED ORDER — ACETAMINOPHEN 325 MG PO TABS: 650.0000 mg | ORAL_TABLET | ORAL | Status: DC | PRN

## 2013-03-20 MED ORDER — CIPROFLOXACIN HCL 500 MG PO TABS: 500.0000 mg | ORAL_TABLET | Freq: Two times a day (BID) | ORAL | Status: DC

## 2013-03-20 MED ORDER — PSYLLIUM 95 % PO PACK: PACK | ORAL | Status: DC

## 2013-03-20 MED ORDER — METRONIDAZOLE 500 MG PO TABS: 500.0000 mg | ORAL_TABLET | Freq: Three times a day (TID) | ORAL | Status: DC

## 2013-03-20 MED ORDER — OXYCODONE HCL 5 MG PO TABS: 5.0000 mg | ORAL_TABLET | ORAL | Status: DC | PRN

## 2013-03-20 NOTE — Progress Notes (Signed)
Patient refused dressings Stanford Breed RN 03-20-2013 18:57pm

## 2013-03-20 NOTE — Progress Notes (Signed)
Patient seen and examined.  Drains in place.  Home later today or tomorrow.

## 2013-03-20 NOTE — Progress Notes (Signed)
Patient ID: Aaron Everett, male   DOB: Feb 09, 1956, 57 y.o.   MRN: [id number] 3 Days Post-Op  Subjective: Feeling better, had one BM last night.    Objective: Vital signs in last 24 hours: Temp:  [98 F (36.7 C)-98.8 F (37.1 C)] 98 F (36.7 C) (09/02 0514) Pulse Rate:  [65-77] 65 (09/02 0514) Resp:  [18-20] 18 (09/02 0514) BP: (98-118)/(65-80) 118/80 mmHg (09/02 0514) SpO2:  [98 %-100 %] 98 % (09/02 0514)   Intake/Output from previous day: 09/01 0701 - 09/02 0700 In: 1469.7 [P.O.:720; I.V.:749.7] Out: -  Intake/Output this shift:   General appearance: alert and cooperative GI: normal findings: soft, non-tender  Incision: draining well  Lab Results:   Recent Labs  03/19/13 0300 03/20/13 0435  WBC 13.1* 10.7*  HGB 12.9* 12.0*  HCT 39.6 36.8*  PLT 446* 486*   BMET No results found for this basename: NA, K, CL, CO2, GLUCOSE, BUN, CREATININE, CALCIUM,  in the last 72 hours PT/INR No results found for this basename: LABPROT, INR,  in the last 72 hours ABG No results found for this basename: PHART, PCO2, PO2, HCO3,  in the last 72 hours  MEDS, Scheduled . ciprofloxacin  500 mg Oral BID  . metroNIDAZOLE  500 mg Oral Q8H  . psyllium  1 packet Oral BID    Studies/Results: Dg Chest Port 1 View  03/19/2013   *RADIOLOGY REPORT*  Clinical Data: Shortness of breath  PORTABLE CHEST - 1 VIEW  Comparison: None.  Findings: Cardiac and mediastinal silhouettes are within normal limits.  The lungs are normally inflated.  No pulmonary edema is identified. No definite pleural effusion, although the right costophrenic angle is incompletely visualized.  No airspace consolidation.  No thorax.  The bony thorax is intact.  IMPRESSION: No acute cardiopulmonary process.   Original Report Authenticated By: Rise Mu, M.D.    Assessment: s/p Procedure(s): IRRIGATION AND DEBRIDEMENT PERIRECTAL HORSESHOE ABSCESS Patient Active Problem List   Diagnosis Date Noted  . Perirectal  abscess 03/18/2013    Plan: Reg diet SItz baths TID and PRN.  Pt may shower instead of Sitz bath, if he'd like Cont PO Abx for a total of 10 days.  Pt can not get a ride to go home today so will plan d/c tomorrow. Ok to d/c home with penrose intact.  He should follow up with Dr. Maisie Fus in 3 weeks.     LOS: 4 days     Mariselda Badalamenti, [facility], [place] [phone]   03/20/2013 8:03 AM

## 2013-03-20 NOTE — Discharge Summary (Signed)
Physician Discharge Summary  Patient ID: Aaron Everett MRN: [id number] DOB/AGE: September 12, 1955 57 y.o.  Admit date: 03/16/2013 Discharge date: 03/21/2013  Admission Diagnoses: Horseshoe rectal abscess                                          Hx of tobacco use, quit 02/23/13  Discharge Diagnoses:  Same  Active Problems:   Perirectal abscess   PROCEDURES: IRRIGATION AND DEBRIDEMENT HORSESHOE PERIRECTAL ABSCESS: HANLEY PROCEEDURE  SURGEON: Surgeon(s): Dr. Romie Levee, 03/17/13.   Hospital Course: this is a 57 year old male who presented to the emergency department last night with severe rectal pain and fever this has  been going on for about a week. A CT scan was performed which showed a horseshoe perirectal abscess. The risks and benefits of incision and drainage of this were explained to the patient prior the OR consent was signed and placed on chart. He was taken to the OR the following Am for the above procedure. He had some SOB and chest pain the post op night.  CXR and troponin were normal, symptoms resolved, ECG was ordered but does not appear to have been done.  He is making good progress.  Pain is well controlled and he wants to go home.  He will do showers and sitz bathes at home.  He will see Dr. Maisie Fus in 2-3 weeks for a recheck.  Physical exam: VSS afebrile Abd; soft, nontender, +BS Buttocks: penrose in place, non purulent drainage   Disposition: Home    Medication List    ASK your doctor about these medications       phenylephrine-shark liver oil-mineral oil-petrolatum 0.25-3-14-71.9 % rectal ointment  Commonly known as:  PREPARATION H  Place 1 application rectally 2 (two) times daily as needed for hemorrhoids.           Follow-up Information   Follow up with Vanita Panda., MD. Schedule an appointment as soon as possible for a visit in 4 weeks.   Specialty:  General Surgery   Contact information:   [address] [place] [place]  [address] [phone]       Signed: Julee Stoll, Lanora Manis 03/21/2013, 7:08 AM

## 2013-03-21 NOTE — Discharge Summary (Signed)
Agree with discharge summary.

## 2013-03-22 LAB — CULTURE, BLOOD (ROUTINE X 2): Culture: NO GROWTH

## 2013-04-06 ENCOUNTER — Encounter (INDEPENDENT_AMBULATORY_CARE_PROVIDER_SITE_OTHER): Payer: Self-pay | Admitting: General Surgery

## 2013-04-06 ENCOUNTER — Ambulatory Visit (INDEPENDENT_AMBULATORY_CARE_PROVIDER_SITE_OTHER): Payer: Self-pay | Admitting: General Surgery

## 2013-04-06 VITALS — BP 108/66 | HR 84 | Temp 97.6°F | Resp 14 | Ht 76.0 in | Wt 220.8 lb

## 2013-04-06 DIAGNOSIS — Z9889 Other specified postprocedural states: Secondary | ICD-10-CM

## 2013-04-06 NOTE — Patient Instructions (Signed)
Continue current treatment. 

## 2013-04-06 NOTE — Progress Notes (Signed)
Aaron Everett is a 57 y.o. 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  He is having reg BM's.  He denies diarrhea.  His pain is controlled.  His drainage is less.  Objective: There were no vitals filed for this visit.  General appearance: alert and cooperative  Incision: healing well, still with a posterior anal defect   Assessment: s/p  Patient Active Problem List   Diagnosis Date Noted  . Hx of tobacco use, presenting hazards to health 03/20/2013  . Perirectal abscess 03/18/2013    Plan: Doing well.  RTO in 3 weeks.  Hopefully penrose can be removed at that time    .Vanita Panda, MD [facility], [place] [phone]   04/06/2013 12:06 PM

## 2013-04-18 ENCOUNTER — Encounter (INDEPENDENT_AMBULATORY_CARE_PROVIDER_SITE_OTHER): Payer: Self-pay | Admitting: General Surgery

## 2013-05-01 ENCOUNTER — Ambulatory Visit (INDEPENDENT_AMBULATORY_CARE_PROVIDER_SITE_OTHER): Payer: Self-pay | Admitting: General Surgery

## 2013-05-01 ENCOUNTER — Encounter (INDEPENDENT_AMBULATORY_CARE_PROVIDER_SITE_OTHER): Payer: Self-pay | Admitting: General Surgery

## 2013-05-01 VITALS — BP 146/84 | HR 100 | Temp 98.1°F | Resp 16 | Ht 76.0 in | Wt 227.0 lb

## 2013-05-01 DIAGNOSIS — K603 Anal fistula, unspecified: Secondary | ICD-10-CM

## 2013-05-01 NOTE — Patient Instructions (Signed)
Continue current management.  Return to the office in 3 months

## 2013-05-01 NOTE — Progress Notes (Signed)
Aaron Everett is a 57 y.o. male who is here for a follow up visit regarding his horseshoe abscess.  He has a penrose drain in place and has noticed increased drainage over the past few days.    Objective: Filed Vitals:   05/01/13 1216  BP: 146/84  Pulse: 100  Temp: 98.1 F (36.7 C)  Resp: 16    General appearance: alert and cooperative GI: normal findings: soft, non-tender   Assessment and Plan: Continue penrose drainage for now.  Will have him return to the office in 4 weeks.  May need fistulotomy vs MAF in the future.      Vanita Panda, MD [facility], [place] [phone]

## 2013-12-18 IMAGING — CT CT ABD-PELV W/ CM
1 of 4 series · 13 of 32 positions shown, 18 images · IV contrast (OMNIPAQUE 300)
Comparison: None.

CLINICAL DATA: Rectal pain.  Fever.  Leukocytosis.

CT ABDOMEN AND PELVIS WITH CONTRAST
TECHNIQUE: Multidetector CT imaging of the abdomen and pelvis was
performed following the standard protocol during bolus
administration of intravenous contrast.
Contrast: 100mL OMNIPAQUE IOHEXOL 300 MG/ML  SOLN

[Series 2: abd/pel with · axial · 0.92mm/px · z∈[+990,+1450]mm · 13 of 104 slices shown, 18 images]
[im 6/104  soft-tissue]
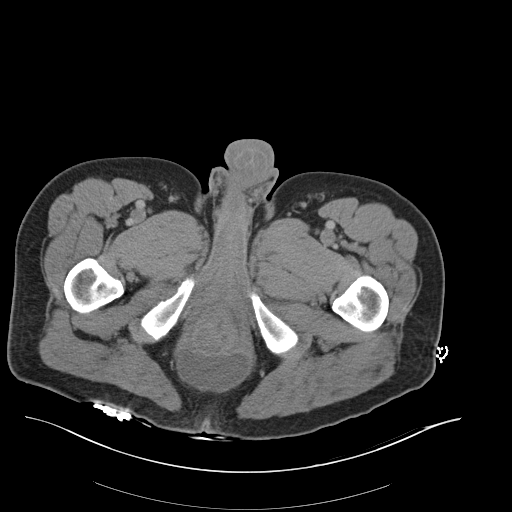
[im 6/104  bone]
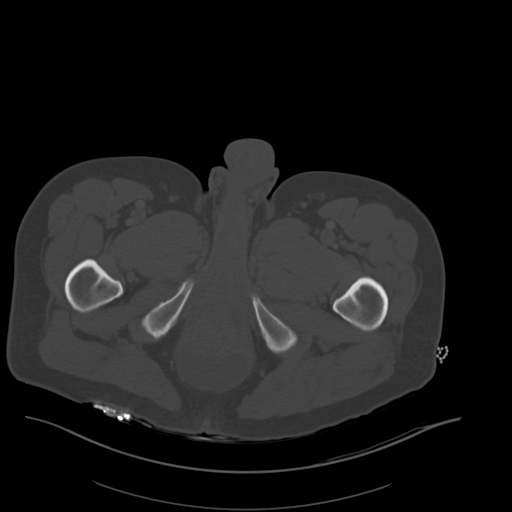
[im 18/104  soft-tissue]
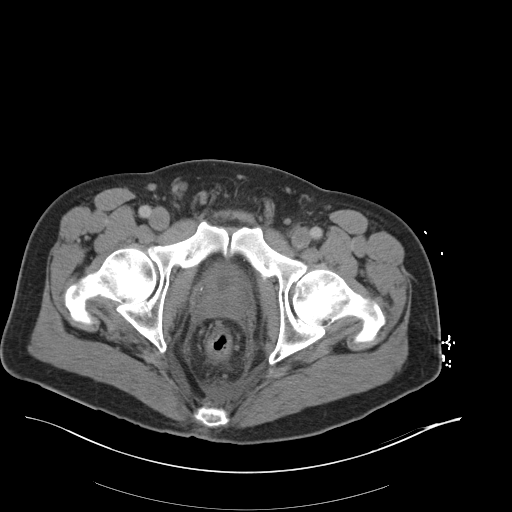
[im 23/104  soft-tissue]
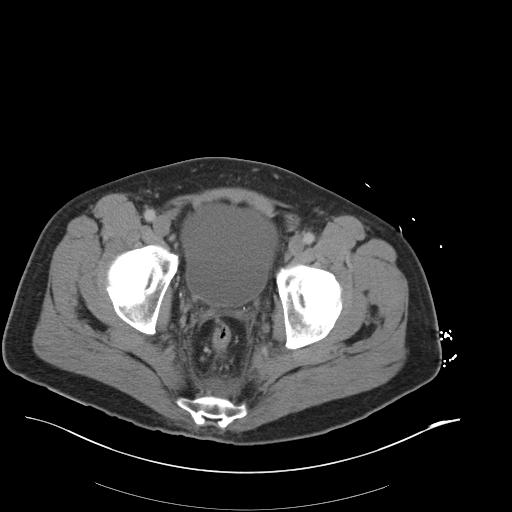
[im 29/104  soft-tissue]
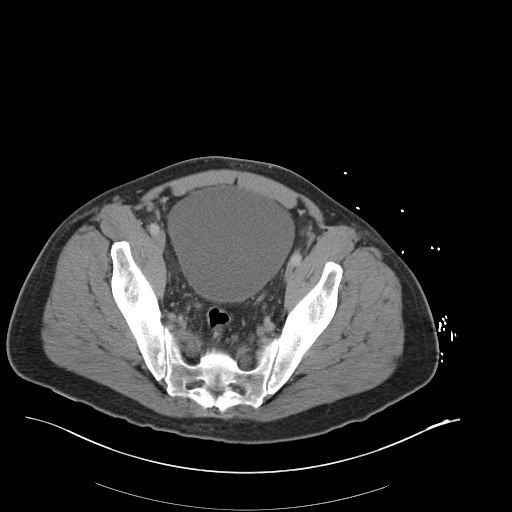
[im 41/104  soft-tissue]
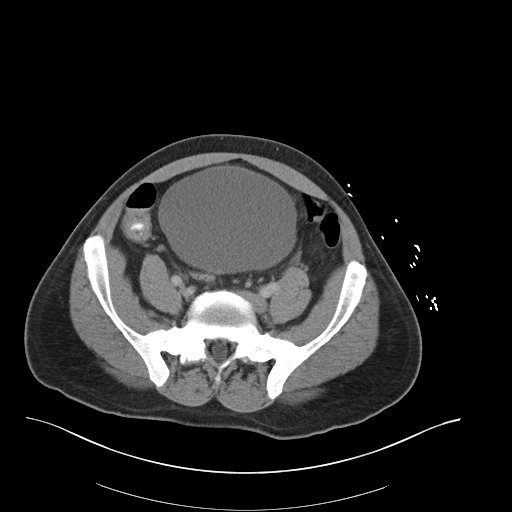
[im 46/104  soft-tissue]
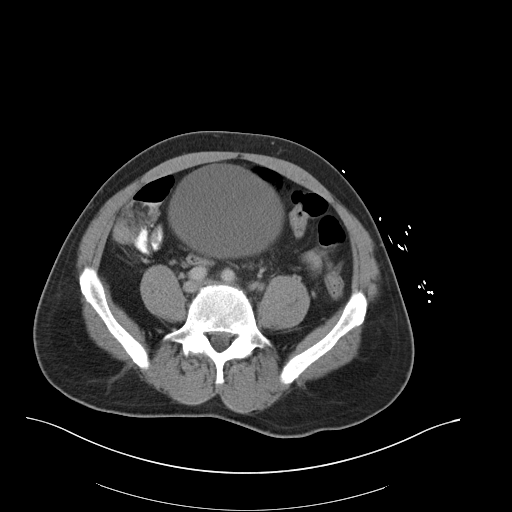
[im 58/104  soft-tissue]
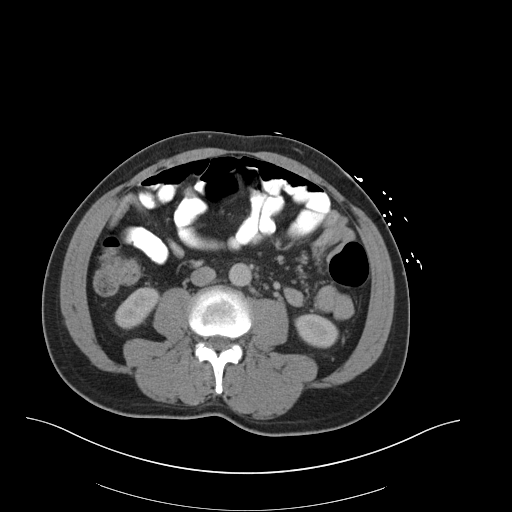
[im 63/104  soft-tissue]
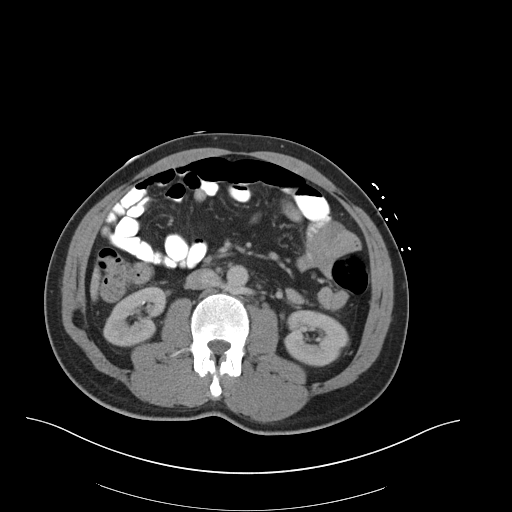
[im 75/104  soft-tissue]
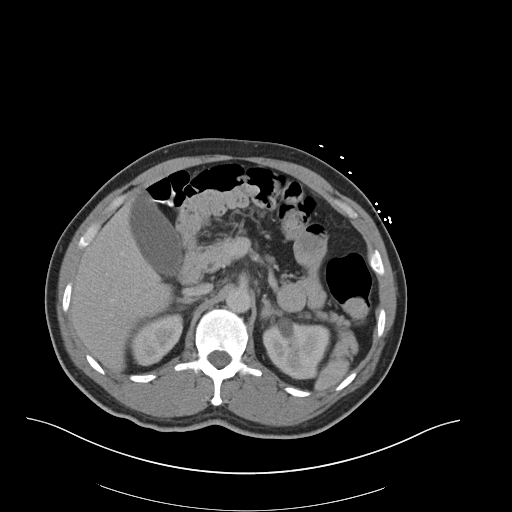
[im 75/104  bone]
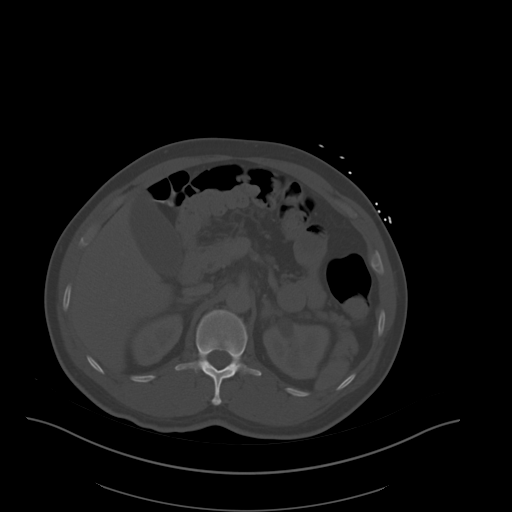
[im 81/104  soft-tissue]
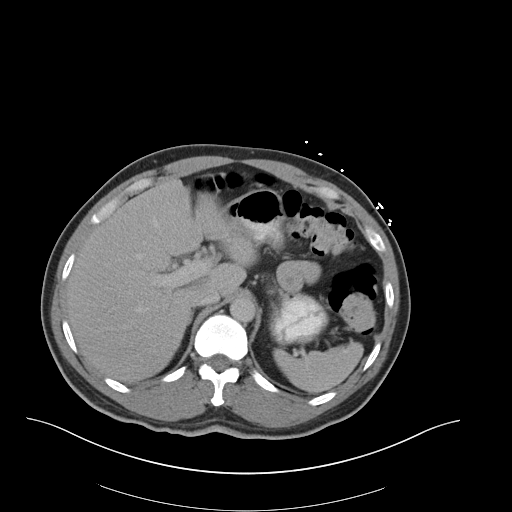
[im 81/104  lung]
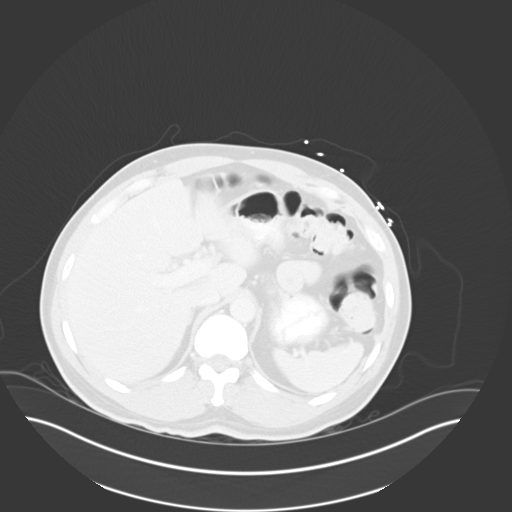
[im 86/104  soft-tissue]
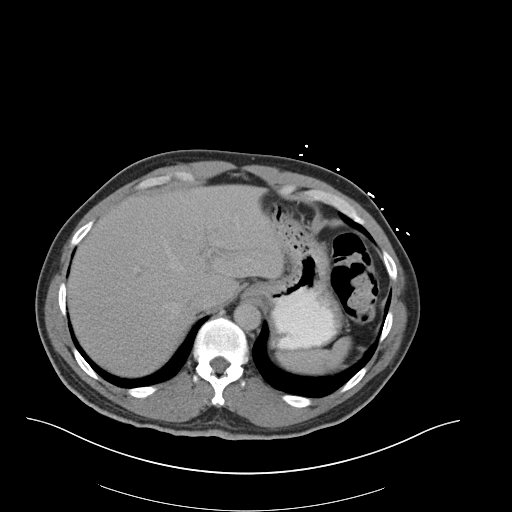
[im 86/104  lung]
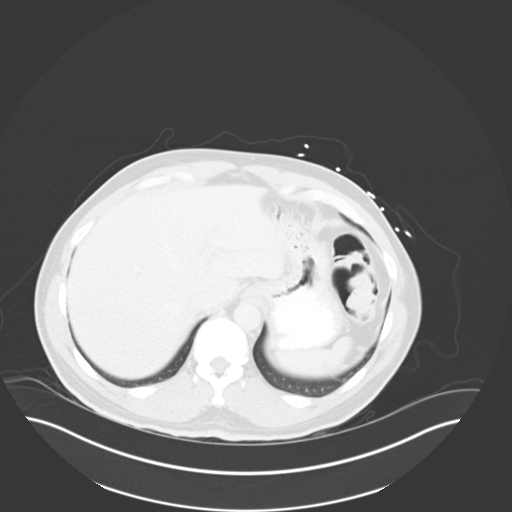
[im 92/104  lung]
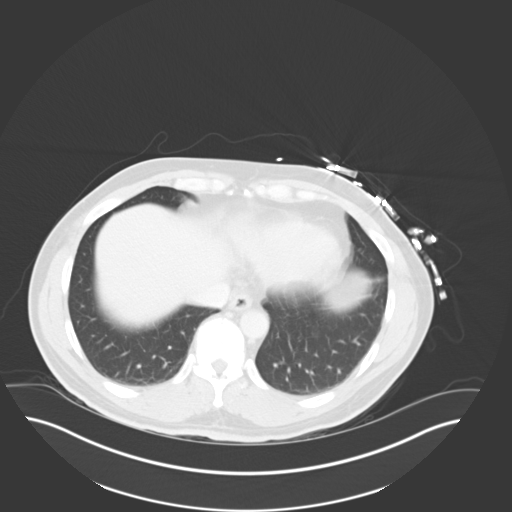
[im 98/104  soft-tissue]
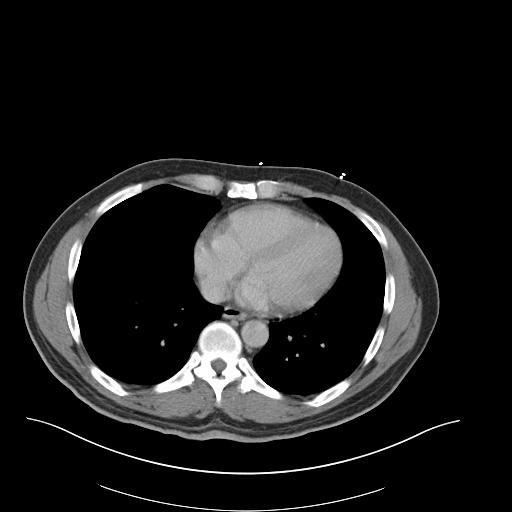
[im 98/104  lung]
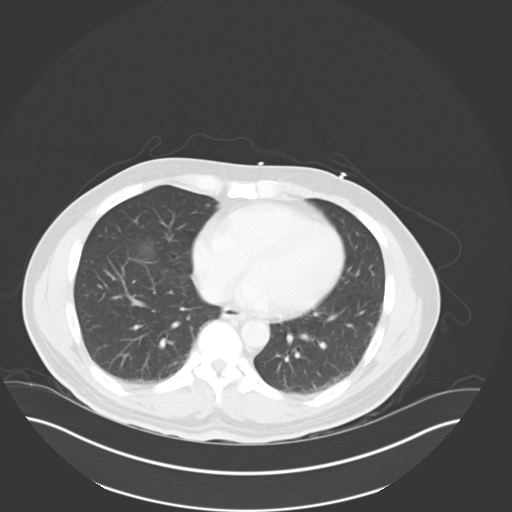

[13 of 32 positions shown; findings below may reference images not displayed]

FINDINGS: Lung Bases: Dependent atelectasis at the lung bases.

Liver:  Tiny low density lesion in the left hepatic lobe likely
represents a cyst.

Spleen:  Normal.

Gallbladder:  Normal.

Common bile duct:  Normal.

Pancreas:  Normal.

Adrenal glands:  Mild thickening on the left, likely hyperplasia.
No discrete lesion.

Kidneys:  Simple left upper pole renal cyst.  Normal renal
enhancement.  A 3 mm right inferior pole renal collecting system
calculus.  Ureters appear within normal limits.  Delayed renal
excretion of contrast is normal.

Stomach:  Normal.

Small bowel:  Normal.

Colon:   Appendix not identified.  Distal colon decompressed. There
is a perirectal fluid collection which is crescent shaped and
extends from the dorsal aspect of the rectum into the perineal
subcutaneous fat.  The maximal thickness in the AP dimension is
cm.  Transversely, this measures 8 cm.  Cranial-caudal extent is at
least 6 cm. There is peri rectal phlegmon and fluid in the
ischiorectal fossa.  Adjacent to the rectum, the fluid collection
appears complex.  This is consistent with abscess.

Pelvic Genitourinary:  Distended urinary bladder.  Prostate gland
appears within normal limits.

Bones:  No aggressive osseous lesions.  Ankylosis of the SI joints.

Vasculature: Retroaortic left renal vein.

Body Wall: Small fat containing lumbar hernia on the right..
IMPRESSION: 1.  Perirectal fluid collection consistent with perirectal abscess.
This lies dorsal to the anorectal junction measuring 8 cm long
axis.
2.  Left renal cyst.
3.  Nonobstructing right inferior pole 3 mm renal collecting system
calculus.

## 2013-12-21 IMAGING — CR DG CHEST 1V PORT
1 series · 2 of 2 positions shown · non-contrast
Comparison: None.

CLINICAL DATA: Shortness of breath

PORTABLE CHEST - 1 VIEW

[Series 1: AP · U · 2 of 2 slices shown]
[im 1/2]
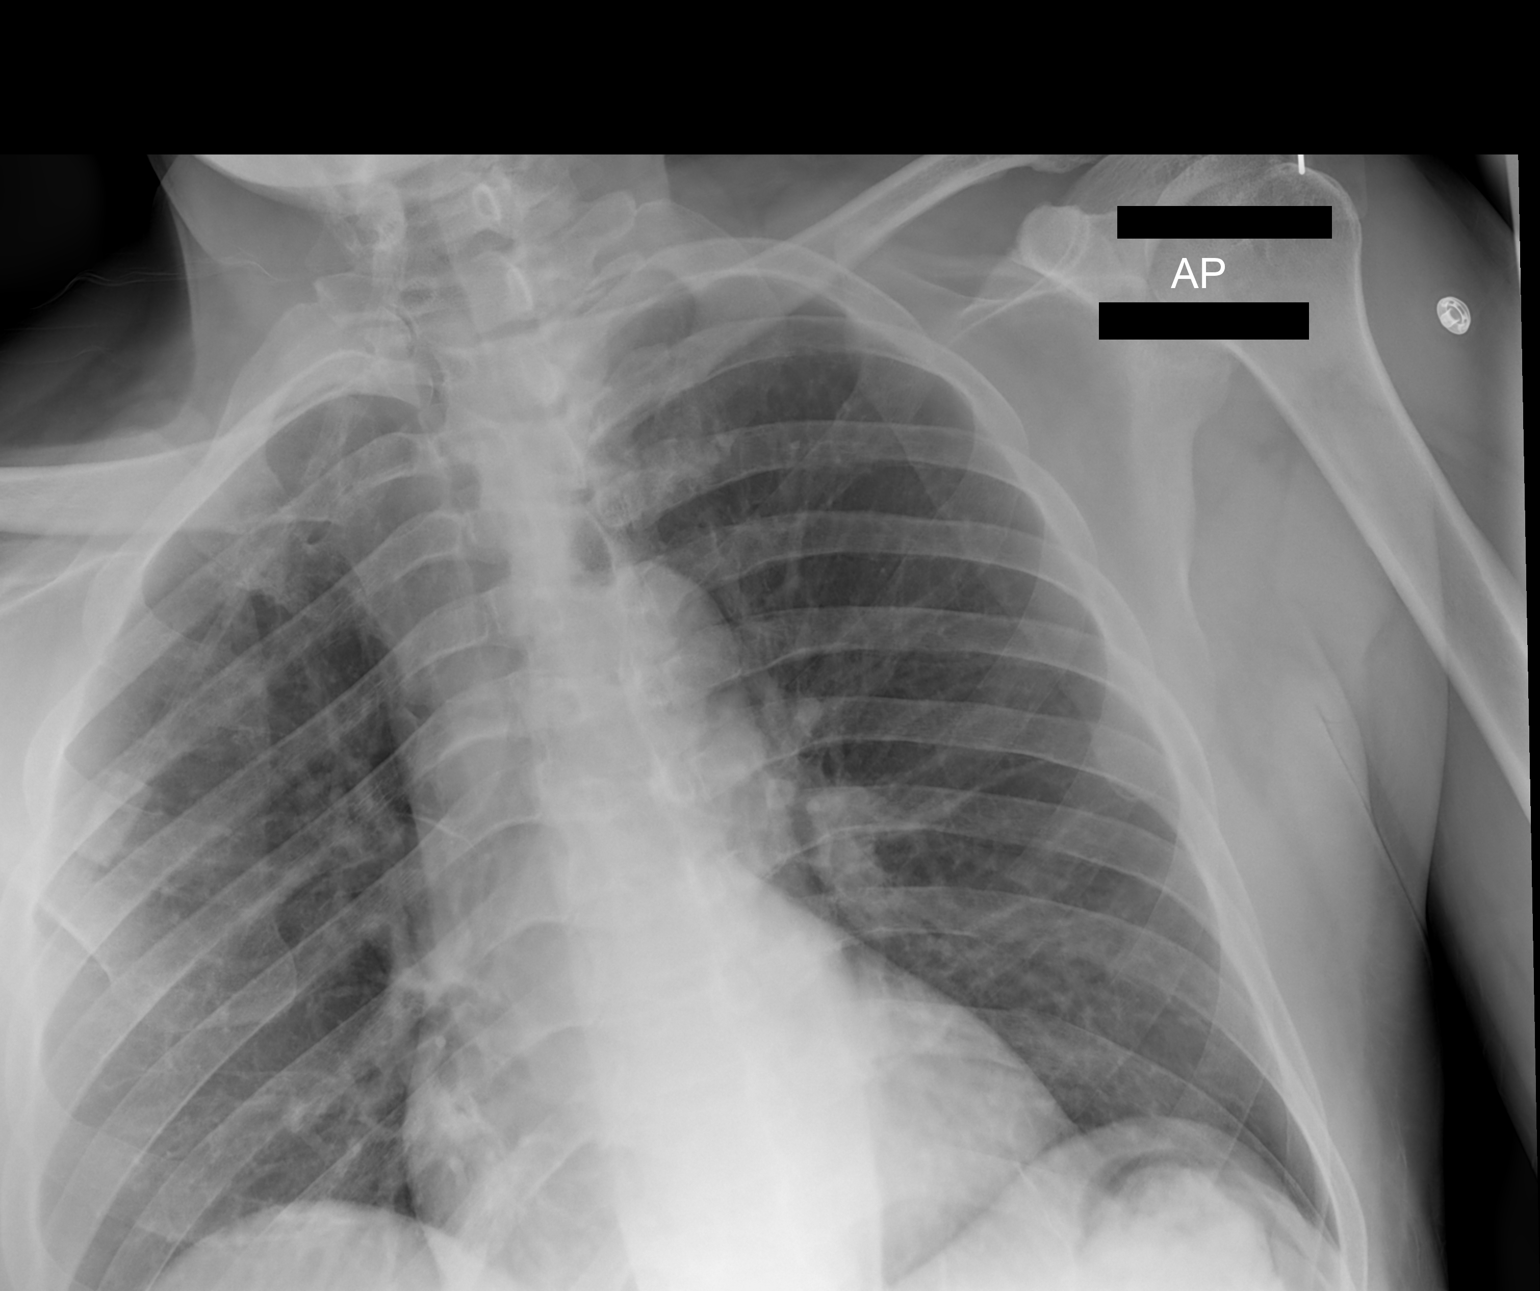
[im 2/2]
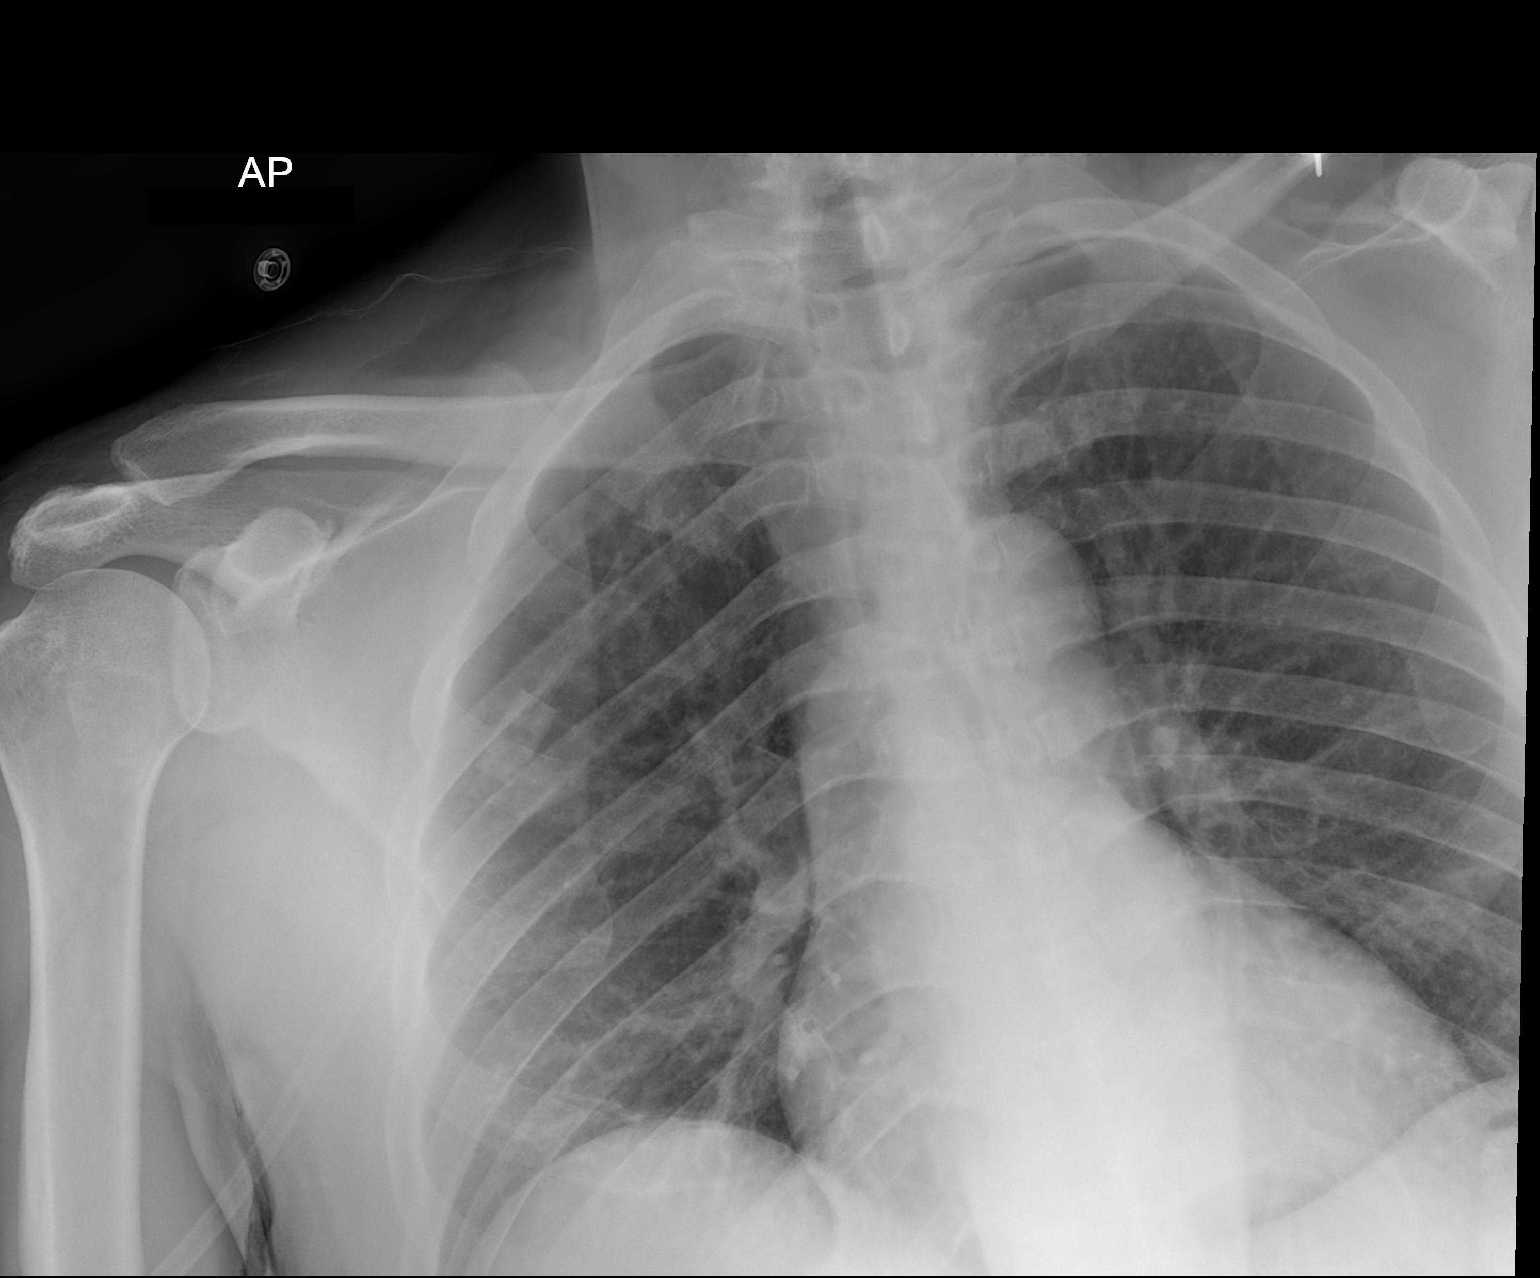

[2 of 2 positions shown; findings below may reference images not displayed]

FINDINGS: Cardiac and mediastinal silhouettes are within normal
limits.

The lungs are normally inflated.  No pulmonary edema is identified.
No definite pleural effusion, although the right costophrenic angle
is incompletely visualized.  No airspace consolidation.  No thorax.

The bony thorax is intact.
IMPRESSION: No acute cardiopulmonary process.

## 2017-01-30 ENCOUNTER — Emergency Department (HOSPITAL_COMMUNITY)
Admission: EM | Admit: 2017-01-30 | Discharge: 2017-01-30 | Disposition: A | Discharge status: Home / Self Care | Payer: Self-pay | Attending: Emergency Medicine | Admitting: Emergency Medicine

## 2017-01-30 ENCOUNTER — Encounter (HOSPITAL_COMMUNITY): Payer: Self-pay | Admitting: Emergency Medicine

## 2017-01-30 DIAGNOSIS — Y998 Other external cause status: Secondary | ICD-10-CM | POA: Insufficient documentation

## 2017-01-30 DIAGNOSIS — Y939 Activity, unspecified: Secondary | ICD-10-CM | POA: Insufficient documentation

## 2017-01-30 DIAGNOSIS — W260XXA Contact with knife, initial encounter: Secondary | ICD-10-CM | POA: Insufficient documentation

## 2017-01-30 DIAGNOSIS — S51812A Laceration without foreign body of left forearm, initial encounter: Secondary | ICD-10-CM | POA: Insufficient documentation

## 2017-01-30 DIAGNOSIS — Y929 Unspecified place or not applicable: Secondary | ICD-10-CM | POA: Insufficient documentation

## 2017-01-30 DIAGNOSIS — Z87891 Personal history of nicotine dependence: Secondary | ICD-10-CM | POA: Insufficient documentation

## 2017-01-30 MED ORDER — LIDOCAINE-EPINEPHRINE (PF) 2 %-1:200000 IJ SOLN
20.0000 mL | Freq: Once | INTRAMUSCULAR | Status: AC
  Administered 2017-01-30: 20 mL

## 2017-01-30 NOTE — Discharge Instructions (Signed)
As discussed, keep the area clean and dry and covered. Follow up with your primary care doctor or the emergency Department for suture removal in 7-10 days. Monitor for any signs of infection return if you notice redness, swelling, purulent discharge, warmth, fever or other concerning symptoms in the meantime.

## 2017-01-30 NOTE — ED Provider Notes (Signed)
[facility]-EMERGENCY DEPT Provider Note   CSN: [id number] Arrival date & time: 01/30/17  2002     History   Chief Complaint Chief Complaint  Patient presents with  . Extremity Laceration    HPI Aaron Everett is a 60 y.o. male presenting with superficial left forearm anterior laceration using a box cutter on drywall just prior to arrival. Patient reports being up-to-date with tetanus immunization. Bleeding controlled. No other symptoms or injuries.  HPI  Past Medical History:  Diagnosis Date  . Hx of tobacco use, presenting hazards to health 03/20/2013    Patient Active Problem List   Diagnosis Date Noted  . Hx of tobacco use, presenting hazards to health 03/20/2013  . Perirectal abscess 03/18/2013    Past Surgical History:  Procedure Laterality Date  . ANKLE SURGERY    . INCISION AND DRAINAGE PERIRECTAL ABSCESS N/A 03/17/2013   Procedure: IRRIGATION AND DEBRIDEMENT PERIRECTAL ABSCESS;  Surgeon: Romie Levee, MD;  Location: [facility] ORS;  Service: General;  Laterality: N/A;  RECTAL  EXAM  UNDER  ANESTHESIA / INCISION AND DRAINAGE RECTAL ABSCESS  . KNEE SURGERY     has had both knee caps replaced       Home Medications    Prior to Admission medications   Not on File    Family History No family history on file.  Social History Social History  Substance Use Topics  . Smoking status: Former Smoker    Packs/day: 0.25    Types: Cigarettes    Quit date: 2011  . Smokeless tobacco: Never Used  . Alcohol use Yes     Comment: occ     Allergies   Shellfish allergy and Penicillins   Review of Systems Review of Systems  Constitutional: Negative for chills and fever.  Gastrointestinal: Negative for nausea and vomiting.  Musculoskeletal: Negative for myalgias.  Skin: Positive for wound. Negative for color change and pallor.  Neurological: Negative for dizziness, weakness and numbness.     Physical Exam Updated Vital Signs BP (!) 156/117 (BP Location: Right Arm)    Pulse 85   Temp (!) 97.5 F (36.4 C) (Oral)   Resp 16   Ht 6\' 4"  (1.93 m)   Wt 115.2 kg (254 lb)   SpO2 99%   BMI 30.92 kg/m   Physical Exam  Constitutional: He appears well-developed and well-nourished. No distress.  Afebrile, nontoxic appearing, walking around the room and hallway, no acute distress, bleeding controlled.  HENT:  Head: Normocephalic and atraumatic.  Eyes: Conjunctivae are normal.  Neck: Neck supple.  Cardiovascular: Normal rate and regular rhythm.   No murmur heard. Pulmonary/Chest: Effort normal. No respiratory distress.  Abdominal: He exhibits no distension.  Musculoskeletal: Normal range of motion. He exhibits no edema, tenderness or deformity.  Neurological: He is alert. No sensory deficit.  Skin: Skin is warm and dry. No rash noted. He is not diaphoretic. No erythema. No pallor.  Approximately 5 cm superficial laceration of the left anterior forearm.   Psychiatric: He has a normal mood and affect.  Nursing note and vitals reviewed.    ED Treatments / Results  Labs (all labs ordered are listed, but only abnormal results are displayed) Labs Reviewed - No data to display  EKG  EKG Interpretation None       Radiology No results found.  Procedures Procedures (including critical care time) LACERATION REPAIR Performed by: Georgiana Shore Authorized by: Georgiana Shore Consent: Verbal consent obtained. Risks and benefits: risks, benefits and alternatives  were discussed Consent given by: patient Patient identity confirmed: provided demographic data Prepped and Draped in normal sterile fashion Wound explored  Laceration Location: Left anterior forearm  Laceration Length: 5 cm  No Foreign Bodies seen or palpated  Anesthesia: local infiltration  Local anesthetic: lidocaine 1 % with epinephrine  Anesthetic total: 4 ml  Irrigation method: syringe Amount of cleaning: standard  Skin closure: 5.0 proline nonabsorbable   Number of  sutures: 6   Technique: Horizontal mattress (3), simple interrupted (3)   Patient tolerance: Patient tolerated the procedure well with no immediate complications.  Medications Ordered in ED Medications  lidocaine-EPINEPHrine (XYLOCAINE W/EPI) 2 %-1:200000 (PF) injection 20 mL (not administered)     Initial Impression / Assessment and Plan / ED Course  I have reviewed the triage vital signs and the nursing notes.  Pertinent labs & imaging results that were available during my care of the patient were reviewed by me and considered in my medical decision making (see chart for details).     Patient presented with approximately 5 cm superficial laceration of the left forearm after cutting drywall with a box cutter which slipped just prior to arrival.  Pressure irrigation performed. Wound explored and base of wound visualized in a bloodless field without evidence of foreign body.  Laceration occurred < 8 hours prior to repair which was well tolerated. Tdap up to date.  Pt has no comorbidities to affect normal wound healing. Pt discharged without antibiotics.    Discussed suture home care with patient and answered questions. Pt to follow-up for wound check and suture removal in 7-10 days; they are to return to the ED sooner for signs of infection. Pt is hemodynamically stable with no complaints prior to dc.   Discussed strict return precautions and advised to return to the emergency department if experiencing any new or worsening symptoms. Instructions were understood and patient agreed with discharge plan. Final Clinical Impressions(s) / ED Diagnoses   Final diagnoses:  Laceration of left forearm, initial encounter    New Prescriptions New Prescriptions   No medications on file     Gregary CromerMitchell, Sandria Mcenroe B, PA-C 01/30/17 2158    Margarita Grizzleay, Danielle, MD 01/30/17 [phone]

## 2017-01-30 NOTE — ED Notes (Signed)
Suture cart at bedside, wound to L forearm cleaned. No bleeding noted, pt reports no pain

## 2017-01-30 NOTE — ED Notes (Signed)
PA at bedsoide for suture repair

## 2017-01-30 NOTE — ED Triage Notes (Signed)
Pt to ED for laceration to L forearm after cutting piece of drywall and the boxcutter slipped and got his arm. Bleeding controlled at this time. CSM intact.

## 2024-10-04 ENCOUNTER — Ambulatory Visit: Payer: Self-pay
